# Patient Record
Sex: Male | Born: 1965 | Race: White | Hispanic: No | Marital: Single | Smoking: Current every day smoker
Health system: Southern US, Community
[De-identification: ages and names within clinical notes are randomized; demographics above are authoritative.]

## PROBLEM LIST (undated history)

## (undated) DIAGNOSIS — K5790 Diverticulosis of intestine, part unspecified, without perforation or abscess without bleeding: Secondary | ICD-10-CM

## (undated) HISTORY — PX: HERNIA REPAIR: SHX51

---

## 2016-12-23 ENCOUNTER — Emergency Department (HOSPITAL_COMMUNITY)
Admission: EM | Admit: 2016-12-23 | Discharge: 2016-12-23 | Disposition: A | Discharge status: Home / Self Care | Payer: Self-pay | Attending: Emergency Medicine | Admitting: Emergency Medicine

## 2016-12-23 ENCOUNTER — Encounter (HOSPITAL_COMMUNITY): Payer: Self-pay | Admitting: *Deleted

## 2016-12-23 ENCOUNTER — Emergency Department (HOSPITAL_COMMUNITY): Payer: Self-pay

## 2016-12-23 DIAGNOSIS — Y999 Unspecified external cause status: Secondary | ICD-10-CM | POA: Insufficient documentation

## 2016-12-23 DIAGNOSIS — S60420A Blister (nonthermal) of right index finger, initial encounter: Secondary | ICD-10-CM

## 2016-12-23 DIAGNOSIS — Y939 Activity, unspecified: Secondary | ICD-10-CM | POA: Insufficient documentation

## 2016-12-23 DIAGNOSIS — X58XXXA Exposure to other specified factors, initial encounter: Secondary | ICD-10-CM | POA: Insufficient documentation

## 2016-12-23 DIAGNOSIS — R55 Syncope and collapse: Secondary | ICD-10-CM

## 2016-12-23 DIAGNOSIS — B9789 Other viral agents as the cause of diseases classified elsewhere: Secondary | ICD-10-CM

## 2016-12-23 DIAGNOSIS — Y929 Unspecified place or not applicable: Secondary | ICD-10-CM | POA: Insufficient documentation

## 2016-12-23 DIAGNOSIS — F17210 Nicotine dependence, cigarettes, uncomplicated: Secondary | ICD-10-CM | POA: Insufficient documentation

## 2016-12-23 DIAGNOSIS — R222 Localized swelling, mass and lump, trunk: Secondary | ICD-10-CM

## 2016-12-23 DIAGNOSIS — J069 Acute upper respiratory infection, unspecified: Secondary | ICD-10-CM

## 2016-12-23 HISTORY — DX: Diverticulosis of intestine, part unspecified, without perforation or abscess without bleeding: K57.90

## 2016-12-23 LAB — BASIC METABOLIC PANEL
Anion gap: 7 (ref 5–15)
BUN: 13 mg/dL (ref 6–20)
CALCIUM: 9.3 mg/dL (ref 8.9–10.3)
CO2: 27 mmol/L (ref 22–32)
CREATININE: 0.85 mg/dL (ref 0.61–1.24)
Chloride: 103 mmol/L (ref 101–111)
GFR calc non Af Amer: >60 mL/min (ref >60)
Glucose, Bld: 96 mg/dL (ref 65–99)
Potassium: 4.3 mmol/L (ref 3.5–5.1)
Sodium: 137 mmol/L (ref 135–145)

## 2016-12-23 LAB — CBC WITH DIFFERENTIAL/PLATELET
BASOS PCT: 1 %
Basophils Absolute: 0.1 10*3/uL (ref 0.0–0.1)
Eosinophils Absolute: 0.2 10*3/uL (ref 0.0–0.7)
Eosinophils Relative: 3 %
HCT: 45.9 % (ref 39.0–52.0)
Hemoglobin: 16.1 g/dL (ref 13.0–17.0)
Lymphocytes Relative: 48 %
Lymphs Abs: 2.9 10*3/uL (ref 0.7–4.0)
MCH: 33.2 pg (ref 26.0–34.0)
MCHC: 35.1 g/dL (ref 30.0–36.0)
MCV: 94.6 fL (ref 78.0–100.0)
MONO ABS: 0.6 10*3/uL (ref 0.1–1.0)
MONOS PCT: 10 %
NEUTROS PCT: 38 %
Neutro Abs: 2.3 10*3/uL (ref 1.7–7.7)
PLATELETS: 169 10*3/uL (ref 150–400)
RBC: 4.85 MIL/uL (ref 4.22–5.81)
RDW: 13.7 % (ref 11.5–15.5)
WBC: 6.2 10*3/uL (ref 4.0–10.5)

## 2016-12-23 MED ORDER — ALBUTEROL SULFATE HFA 108 (90 BASE) MCG/ACT IN AERS
2.0000 | INHALATION_SPRAY | RESPIRATORY_TRACT | Status: DC | PRN
  Administered 2016-12-23: 2 via RESPIRATORY_TRACT
  Filled 2016-12-23: qty 6.7

## 2016-12-23 MED ORDER — ALBUTEROL SULFATE HFA 108 (90 BASE) MCG/ACT IN AERS: 1.0000 | INHALATION_SPRAY | Freq: Four times a day (QID) | RESPIRATORY_TRACT | 0 refills | Status: AC | PRN

## 2016-12-23 NOTE — Discharge Instructions (Signed)
You were seen in the ED today with multiple medical complaints. Your labs are normal. You will need to establish care with a PCP and I have listed these options for you.   Follow up with the hand surgeon after discussion with your PCP. Return to the ED with any chest pain, difficulty breathing, or additional episodes of passing out.

## 2016-12-23 NOTE — ED Provider Notes (Signed)
Emergency Department Provider Note   I have reviewed the triage vital signs and the nursing notes.   HISTORY  Chief Complaint Cough   HPI Cindie LarocheJames Mecham is a 51 y.o. male with PMH of diverticulosis, tobacco use, and no PCP presents to the emergency pertinent for evaluation of multiple medical complaints including 2 weeks of URI symptoms, frostbite to the right index finger from 1 week prior, an episode of near syncope 1 week prior, and enlarging chest wall mass for the last 1-2 weeks. Patient states that he's been feeling generally ill for the past 2 weeks and because of this he felt like he almost passed out working outside week ago. He denies any loss of consciousness. He did have some minor head trauma and associated headache that is since resolved. No preceding chest pain, difficulty breathing, heart palpitations. While working outside he also developed some frost bite to the right index finger. He noticed large blister formation that seems to be resolving. He has some resulting eschar formation over the tip of the right finger. He reports normal sensation and range of motion of the finger. No prior history of syncope. Denies alcohol or drug use. No additional symptoms in the last week.  Patient is also concerned about a small, firm mass the bottom of his chest/top of his abdomen. No obvious injury to the area. Denies drainage. No overlying rash. No similar lesions in other parts of the body or in his past. Patient denies having health insurance or a primary care provider. He does not take daily medications. He does continue to smoke cigarettes and drinks occasionally.   Past Medical History:  Diagnosis Date  . Diverticulosis     There are no active problems to display for this patient.   Past Surgical History:  Procedure Laterality Date  . HERNIA REPAIR        Allergies Iodine  No family history on file.  Social History Social History  Substance Use Topics  . Smoking  status: Current Every Day Smoker    Packs/day: 1.00    Types: Cigarettes  . Smokeless tobacco: Never Used  . Alcohol use No    Review of Systems  Constitutional: No fever/chills. Positive near-syncope with fall and head trauma.  Eyes: No visual changes. ENT: No sore throat. Cardiovascular: Denies chest pain. Positive chest wall mass. Respiratory: Denies shortness of breath. Positive cough.  Gastrointestinal: No abdominal pain.  No nausea, no vomiting.  No diarrhea.  No constipation. Genitourinary: Negative for dysuria. Musculoskeletal: Negative for back pain.  Skin: Negative for rash. Positive right index finger blistering.  Neurological: Negative for focal weakness or numbness. HA last week that is now resolved.   10-point ROS otherwise negative.  ____________________________________________   PHYSICAL EXAM:  VITAL SIGNS: ED Triage Vitals [12/23/16 0941]  Enc Vitals Group     BP 153/92     Pulse Rate 81     Resp 18     Temp 98.3 F (36.8 C)     Temp Source Oral     SpO2 100 %     Weight 180 lb (81.6 kg)     Height 5\' 11"  (1.803 m)     Pain Score 0   Constitutional: Alert and oriented. Well appearing and in no acute distress. Eyes: Conjunctivae are normal. PERRL. Head: Atraumatic. Ears:  Healthy appearing ear canals and TMs bilaterally Nose: No congestion/rhinnorhea. Mouth/Throat: Mucous membranes are moist.  Oropharynx non-erythematous. Neck: No stridor.  Cardiovascular: Normal rate, regular rhythm.  Good peripheral circulation. Grossly normal heart sounds.   Respiratory: Normal respiratory effort.  No retractions. Lungs CTAB. Gastrointestinal: Soft and nontender. No distention.  Musculoskeletal: No lower extremity tenderness nor edema. No gross deformities of extremities. Firm, non-mobile, non-tender mass at the base of the sternum.  Neurologic:  Normal speech and language. No gross focal neurologic deficits are appreciated.  Skin:  Skin is warm, dry and intact.  Firm skin with discoloration to the tip of the right index finger. No surrounding erythema, drainage, or fluctuance.  Psychiatric: Mood and affect are normal. Speech and behavior are normal.  ____________________________________________   LABS (all labs ordered are listed, but only abnormal results are displayed)  Labs Reviewed  BASIC METABOLIC PANEL  CBC WITH DIFFERENTIAL/PLATELET   ____________________________________________  EKG   EKG Interpretation  Date/Time:  Friday December 23 2016 15:04:52 EST Ventricular Rate:  68 PR Interval:    QRS Duration: 101 QT Interval:  386 QTC Calculation: 411 R Axis:   99 Text Interpretation:  Right and left arm electrode reversal, interpretation assumes no reversal Sinus rhythm Probable lateral infarct, age indeterminate Minimal ST elevation, inferior leads No STEMI.  Confirmed by LONG MD, JOSHUA [phone]) on 12/23/2016 3:29:32 PM      ____________________________________________  RADIOLOGY  EXAM:  CHEST 2 VIEW    COMPARISON: None.    FINDINGS:  The heart size and mediastinal contours are within normal limits.  Both lungs are clear. No pleural effusion or pneumothorax. The  visualized skeletal structures are unremarkable.    IMPRESSION:  No active cardiopulmonary disease.      Electronically Signed  By: Amie Portland M.D.  On: 12/23/2016 10:20    ____________________________________________   PROCEDURES  Procedure(s) performed:   Procedures  ULTRASOUND LIMITED SOFT TISSUE/ MUSCULOSKELETAL:  Indication: Central chest mass Linear probe used to evaluate area of interest in two planes. Findings:  No identifiable fluid collection. Performed by: Dr Jacqulyn Bath Images saved electronically ____________________________________________   INITIAL IMPRESSION / ASSESSMENT AND PLAN / ED COURSE  Pertinent labs & imaging results that were available during my care of the patient were reviewed by me and considered in my  medical decision making (see chart for details).  Patient resents to the emergency department for evaluation of multiple medical complaints.  Index finger blistering:  Patient with possible exposure injury. There is some firm blistering to the tip of the right finger. No picture above. No debridement required at this time. No evidence of infection. No indication for antibiotics. Plan for primary care physician evaluation and referral to hand surgery as needed. Plan to provide follow-up information for hand surgery at discharge. Advise standard wound care and monitoring.   Near-syncope with fall and head trauma: Near syncope event 1 week prior that resulted in a fall with mild head trauma. Headache resolved. Neurological exam is nonfocal. No indication for CT scan of the head with remote incident and return to normal function. May have experienced a mild concussion. Will obtain baseline chemistry and CBC for further evaluation.   Chest wall mass:  Firm mass that appears affixed to the lower part of the sternum. No fluctuance or erythema. Low suspicion for abscess or cyst. Plan for bedside US and PCP follow up.   URI symptoms for the last 2 weeks:  Patient with URI symptoms for the past 2 weeks. Chest x-ray with no focal infiltrate. Advised patient on smoking cessation. Plan to discharge with albuterol inhaler at discharge.   At this time, I do not feel there  is any life-threatening condition present. I have reviewed and discussed all results (EKG, imaging, lab, urine as appropriate), exam findings with patient. I have reviewed nursing notes and appropriate previous records.  I feel the patient is safe to be discharged home without further emergent workup. Discussed usual and customary return precautions. Patient and family (if present) verbalize understanding and are comfortable with this plan.  Patient will follow-up with their primary care provider. If they do not have a primary care provider,  information for follow-up has been provided to them. All questions have been answered.  ____________________________________________  FINAL CLINICAL IMPRESSION(S) / ED DIAGNOSES  Final diagnoses:  Viral URI with cough  Near syncope  Blister (nonthermal) of right index finger, initial encounter  Chest wall mass     MEDICATIONS GIVEN DURING THIS VISIT:  None  NEW OUTPATIENT MEDICATIONS STARTED DURING THIS VISIT:  Discharge Medication List as of 12/23/2016  4:17 PM    START taking these medications   Details  albuterol (PROVENTIL HFA;VENTOLIN HFA) 108 (90 Base) MCG/ACT inhaler Inhale 1-2 puffs into the lungs every 6 (six) hours as needed for wheezing or shortness of breath., Starting Fri 12/23/2016, Print         Note:  This document was prepared using Dragon voice recognition software and may include unintentional dictation errors.  Alona Bene, MD Emergency Medicine   Maia Plan, MD 12/24/16 1050

## 2016-12-23 NOTE — ED Triage Notes (Signed)
Pt comes in with multiple issues. Pt states he was out in the woods last week when he had and episode of lightheadedness and fell to the ground, states he hit his head. Denies loss of consciousness. Pt states while he was in the woods he got frost bite on his right index finger. Also, pt has had a cough and congestion that he can't get over. States this has been going on 2 weeks. Pt adds that he has a knot in his chest that he wants looked at. Pt has family history of cysts.   Pt denies having a family doctor.

## 2018-02-13 IMAGING — DX DG CHEST 2V
2 series · 2 of 2 positions shown · non-contrast
Comparison: None.

CLINICAL DATA: Cough and congestion x couple of weeks/light
headiness x 1 week/lump on chest x couple of months/smoker

EXAM:
CHEST  2 VIEW

[chest pa]
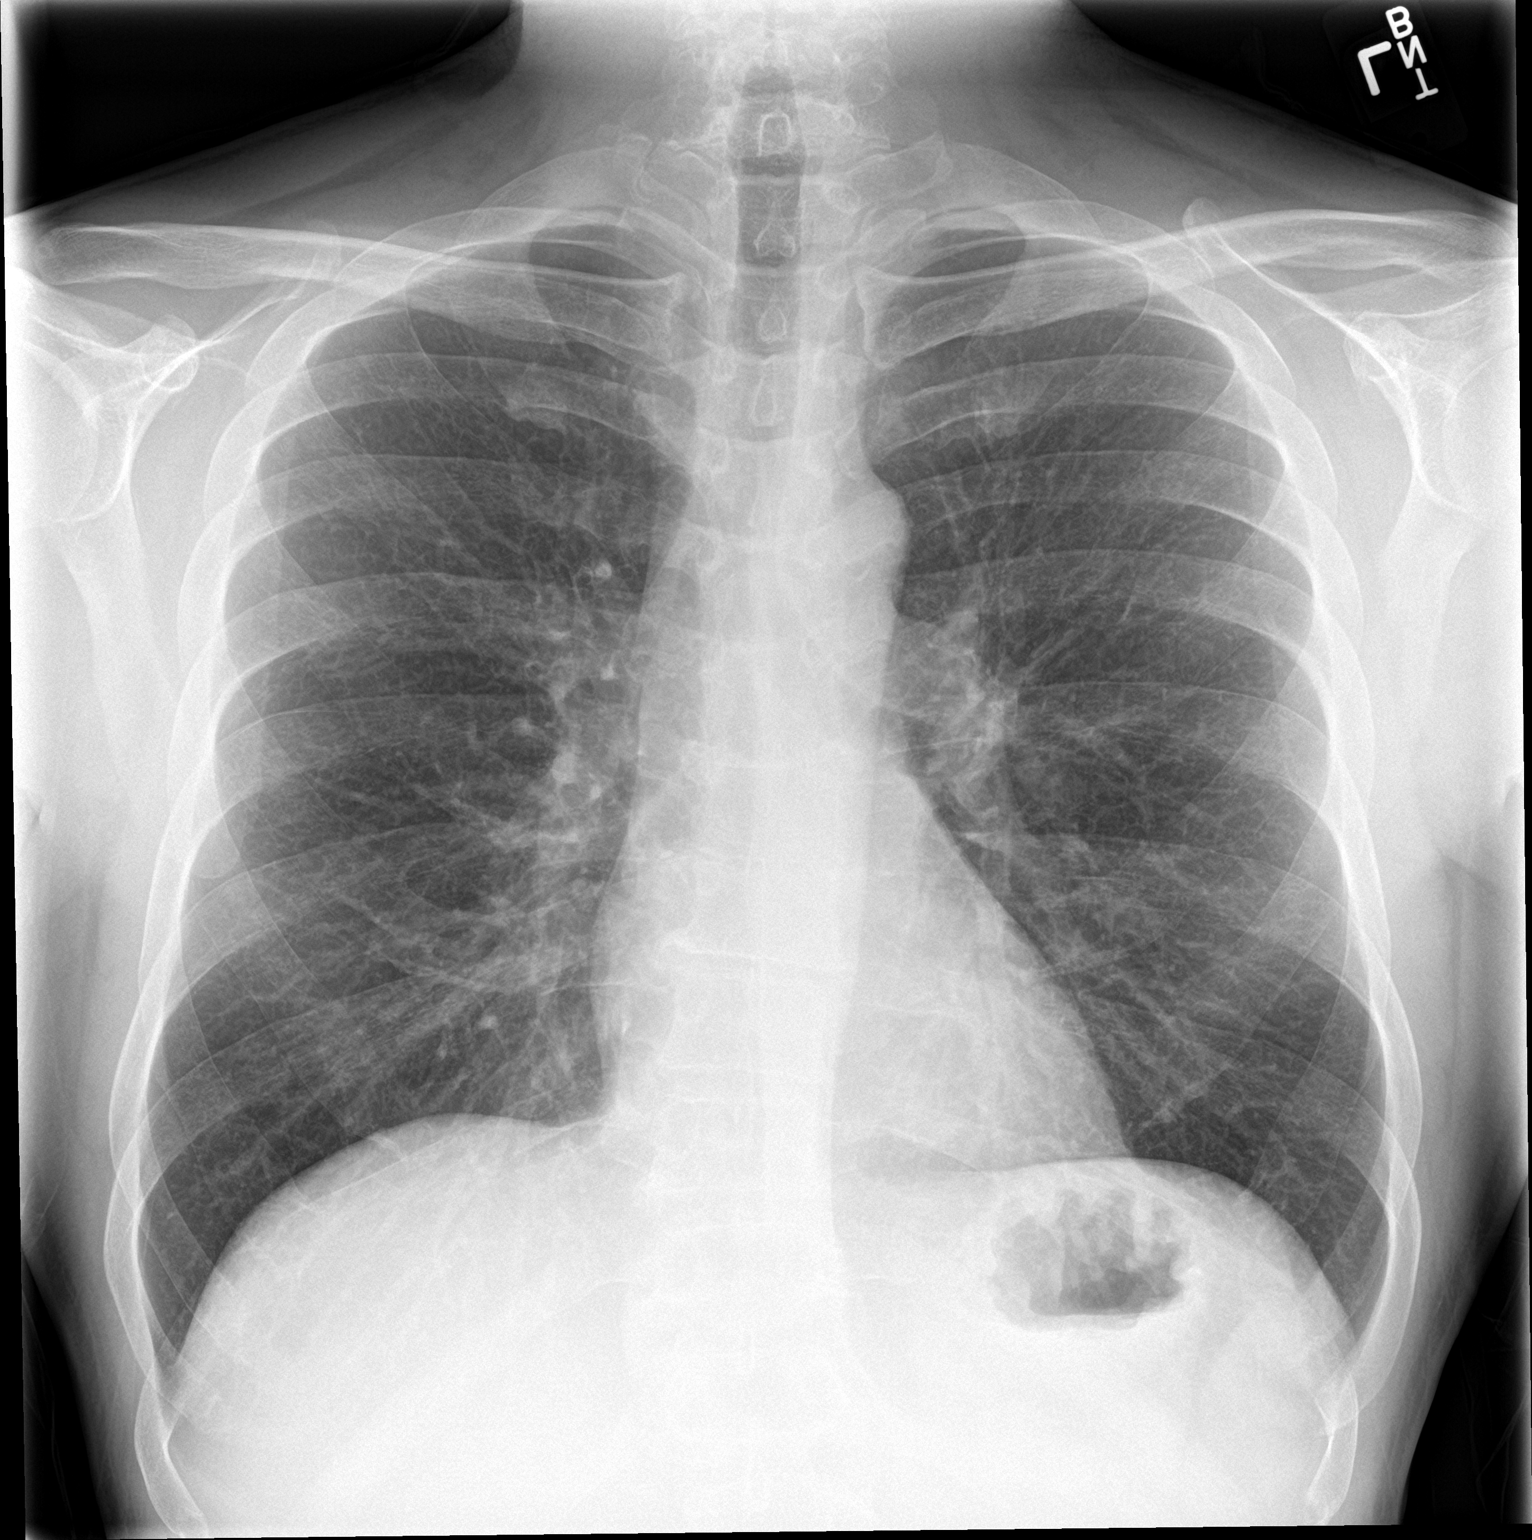

[chest lat]
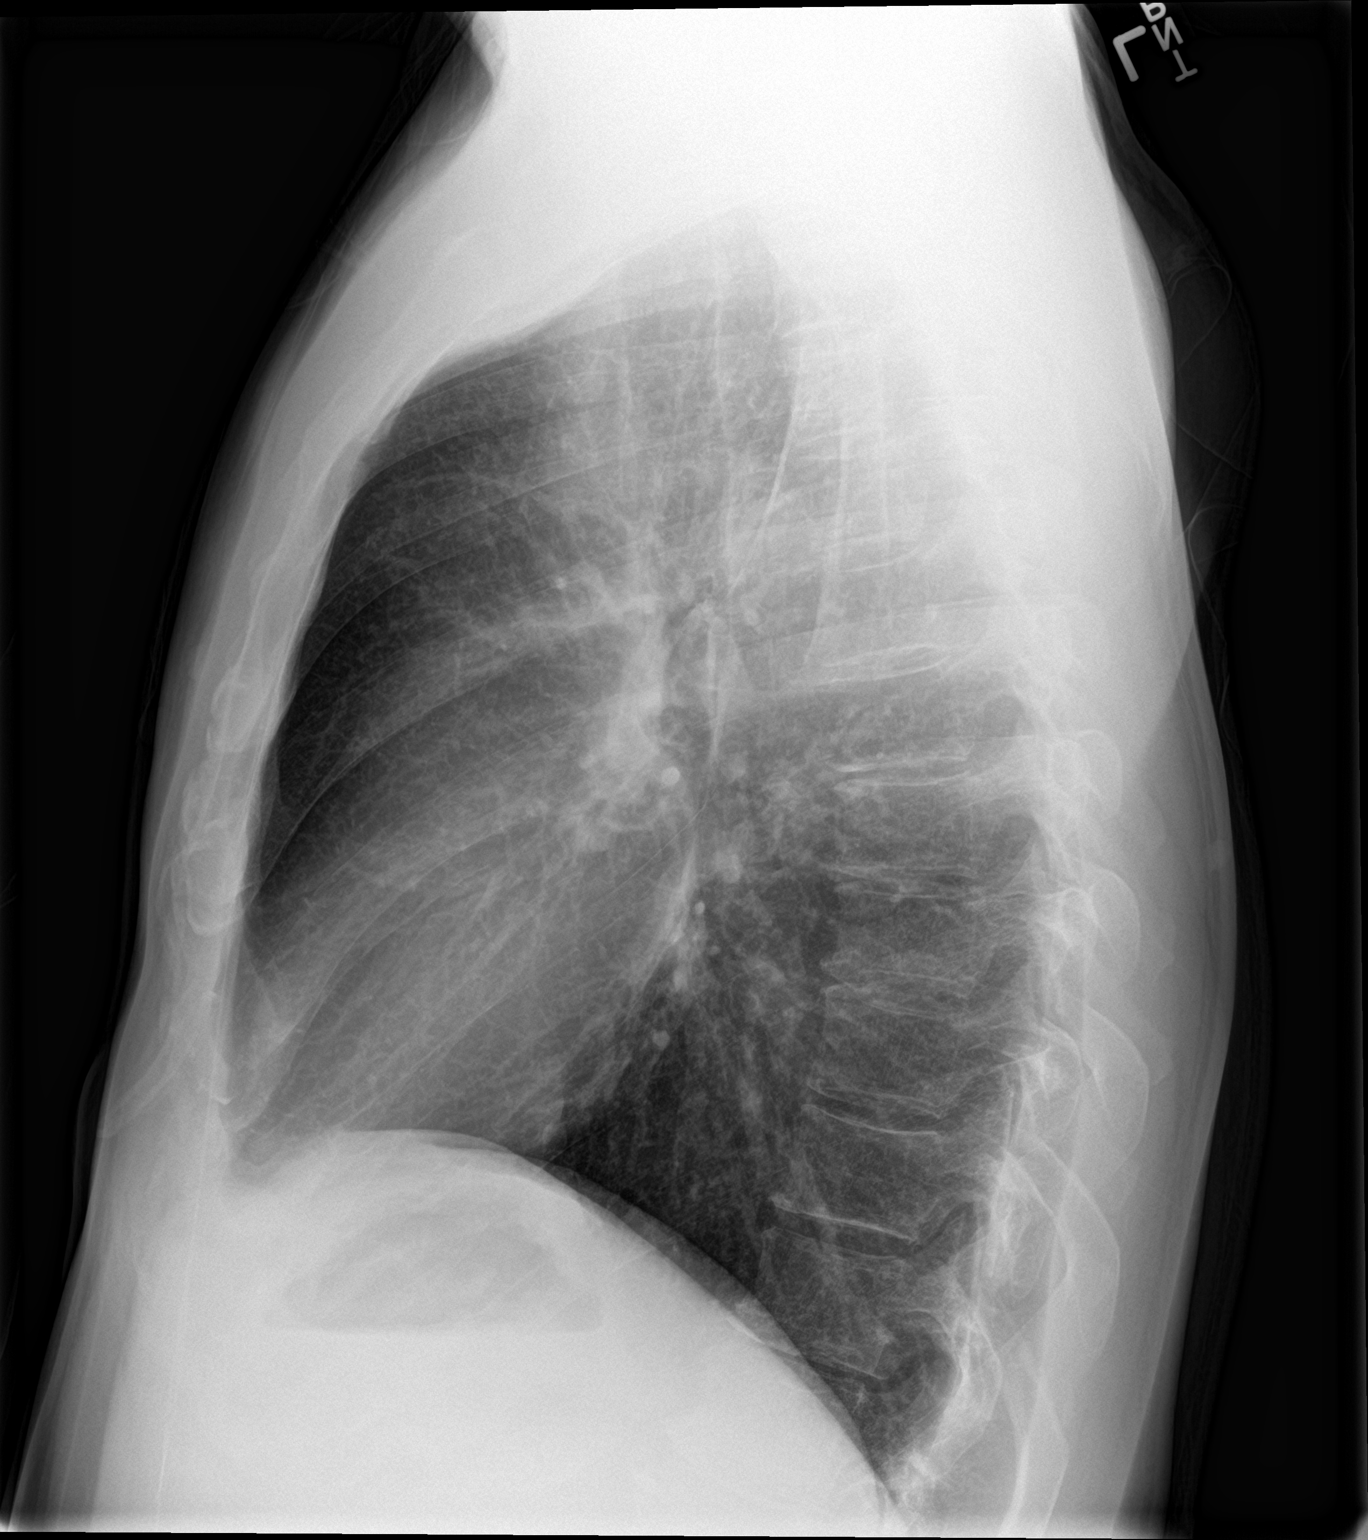

[2 of 2 positions shown; findings below may reference images not displayed]

FINDINGS: The heart size and mediastinal contours are within normal limits.
Both lungs are clear. No pleural effusion or pneumothorax. The
visualized skeletal structures are unremarkable.
IMPRESSION: No active cardiopulmonary disease.

## 2024-05-14 ENCOUNTER — Other Ambulatory Visit: Payer: Self-pay

## 2024-05-14 ENCOUNTER — Encounter (HOSPITAL_COMMUNITY): Payer: Self-pay

## 2024-05-14 ENCOUNTER — Emergency Department (HOSPITAL_COMMUNITY)
Admission: EM | Admit: 2024-05-14 | Discharge: 2024-05-14 | Disposition: A | Discharge status: Home / Self Care | Payer: Self-pay | Attending: Student | Admitting: Student

## 2024-05-14 DIAGNOSIS — R1030 Lower abdominal pain, unspecified: Secondary | ICD-10-CM | POA: Insufficient documentation

## 2024-05-14 DIAGNOSIS — F17210 Nicotine dependence, cigarettes, uncomplicated: Secondary | ICD-10-CM | POA: Insufficient documentation

## 2024-05-14 DIAGNOSIS — B356 Tinea cruris: Secondary | ICD-10-CM | POA: Insufficient documentation

## 2024-05-14 MED ORDER — NYSTATIN 100000 UNIT/GM EX POWD: Freq: Once | CUTANEOUS | Status: DC

## 2024-05-14 MED ORDER — CLOTRIMAZOLE 1 % EX CREA
TOPICAL_CREAM | Freq: Two times a day (BID) | CUTANEOUS | Status: DC
  Filled 2024-05-14: qty 15

## 2024-05-14 MED ORDER — CLOTRIMAZOLE 1 % EX CREA: TOPICAL_CREAM | CUTANEOUS | 0 refills | Status: AC

## 2024-05-14 NOTE — ED Notes (Signed)
Discharge instructions given to patient, no further questions at this time.

## 2024-05-14 NOTE — ED Provider Triage Note (Signed)
 Emergency Medicine Provider Triage Evaluation Note  Cole Duke , a 58 y.o. male  was evaluated in triage.  Pt complains of bilateral groin itching, drainage and possible swollen lymph nodes.  No fevers, no dysuria.  Pulled 2 ticks off right lower leg recently.  Review of Systems  Positive: Tick bites,  groin itching and swelling Negative: Fever, n/v.  Physical Exam  BP (!) 169/89 (BP Location: Right Arm)   Pulse 90   Temp (!) 97.5 F (36.4 C) (Oral)   Resp 16   Ht 5' 11 (1.803 m)   Wt 81.6 kg   SpO2 99%   BMI 25.09 kg/m  Gen:   Awake, no distress   Resp:  Normal effort  MSK:   Moves extremities without difficulty  Other:    Medical Decision Making  Medically screening exam initiated at 9:31 AM.  Appropriate orders placed.  Eural Holzschuh was informed that the remainder of the evaluation will be completed by another provider, this initial triage assessment does not replace that evaluation, and the importance of remaining in the ED until their evaluation is complete.     Katherine Pancake, PA-C 05/14/24 [phone]

## 2024-05-14 NOTE — ED Notes (Signed)
 Pt reports groin area is itchy and moist and has tried using anti-itch cream w/o relief. Pt also reports recent tick bites.

## 2024-05-14 NOTE — ED Provider Notes (Signed)
 [name] EMERGENCY DEPARTMENT AT [facility] Provider Note  CSN: [id number] Arrival date & time: 05/14/24 0901  Chief Complaint(s) Lymphadenopathy  HPI Cole Duke is a 58 y.o. male who presents emergency room for evaluation of groin pain, itching and swelling.  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  Over the last 24 hours he states that he noticed rapid onset redness and itchiness to the groin.  No associated fever, nausea, vomiting, chest pain, shortness of breath or other systemic symptoms.   Past Medical History Past Medical History:  Diagnosis Date   Diverticulosis    There are no active problems to display for this patient.  Home Medication(s) Prior to Admission medications   Medication Sig Start Date End Date Taking? Authorizing Provider  clotrimazole (LOTRIMIN) 1 % cream Apply to affected area 2 times daily 05/14/24  Yes Aaima Gaddie, MD  albuterol  (PROVENTIL  HFA;VENTOLIN  HFA) 108 (90 Base) MCG/ACT inhaler Inhale 1-2 puffs into the lungs every 6 (six) hours as needed for wheezing or shortness of breath. 12/23/16   Long, Shereen Dike, MD                                                                                                                                    Past Surgical History Past Surgical History:  Procedure Laterality Date   HERNIA REPAIR     Family History History reviewed. No pertinent family history.  Social History Social History   Tobacco Use   Smoking status: Every Day    Current packs/day: 1.00    Types: Cigarettes   Smokeless tobacco: Never  Vaping Use   Vaping status: Never Used  Substance Use Topics   Alcohol use: No   Drug use: No   Allergies Iodine  Review of Systems Review of Systems  Skin:  Positive for rash.    Physical Exam Vital Signs  I have reviewed the triage vital signs BP (!) 169/89 (BP Location: Right Arm)   Pulse 90   Temp (!) 97.5 F (36.4 C) (Oral)   Resp  16   Ht 5' 11 (1.803 m)   Wt 81.6 kg   SpO2 99%   BMI 25.09 kg/m   Physical Exam Constitutional:      General: He is not in acute distress.    Appearance: Normal appearance.  HENT:     Head: Normocephalic and atraumatic.     Nose: No congestion or rhinorrhea.   Eyes:     General:        Right eye: No discharge.        Left eye: No discharge.     Extraocular Movements: Extraocular movements intact.     Pupils: Pupils are equal, round, and reactive to light.    Cardiovascular:     Rate and Rhythm: Normal rate and regular rhythm.     Heart sounds: No murmur  heard. Pulmonary:     Effort: No respiratory distress.     Breath sounds: No wheezing or rales.  Abdominal:     General: There is no distension.     Tenderness: There is no abdominal tenderness.  Genitourinary:    Comments: Erythema with satellite lesions  Musculoskeletal:        General: Normal range of motion.     Cervical back: Normal range of motion.   Skin:    General: Skin is warm and dry.   Neurological:     General: No focal deficit present.     Mental Status: He is alert.     ED Results and Treatments Labs (all labs ordered are listed, but only abnormal results are displayed) Labs Reviewed - No data to display                                                                                                                        Radiology No results found.  Pertinent labs & imaging results that were available during my care of the patient were reviewed by me and considered in my medical decision making (see MDM for details).  Medications Ordered in ED Medications  clotrimazole (LOTRIMIN) 1 % cream (has no administration in time range)                                                                                                                                     Procedures Procedures  (including critical care time)  Medical Decision Making / ED Course   This patient presents to the ED  for concern of rash, this involves an extensive number of treatment options, and is a complaint that carries with it a high risk of complications and morbidity.  The differential diagnosis includes tinea cruris, intertrigo, cellulitis, NSTI  MDM: Patient seen emergency room for evaluation of a rash, groin pain and swelling.  Physical exam with erythema in the perineum bilaterally with associated satellite lesions consistent with tinea cruris.  Clotrimazole ordered.  No associated crepitus and low suspicion for NSTI.  No tenderness to palpation.  With exam consistent with tinea infection, CT imaging unlikely to change management and thus deferred.  Encouraged patient to keep the area as dry as possible.  Return precautions given which she voiced understanding he was discharged with outpatient follow-up.   Additional history obtained: -External records from outside source obtained and  reviewed including: Chart review including previous notes, labs, imaging, consultation notes   Medicines ordered and prescription drug management: Meds ordered this encounter  Medications   DISCONTD: nystatin (MYCOSTATIN/NYSTOP) topical powder   clotrimazole (LOTRIMIN) 1 % cream   clotrimazole (LOTRIMIN) 1 % cream    Sig: Apply to affected area 2 times daily    Dispense:  28 g    Refill:  0    -I have reviewed the patients home medicines and have made adjustments as needed  Critical interventions none  Social Determinants of Health:  Factors impacting patients care include: Works outside in the heat, 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

## 2024-05-14 NOTE — ED Triage Notes (Signed)
 Pt arrived via POV c/o swelling in his bilateral groin, upper leg X 2 days. Pt feels like his lymph nodes are swollen. Pt denies dysuria.

## 2024-05-26 ENCOUNTER — Other Ambulatory Visit: Payer: Self-pay

## 2024-05-26 ENCOUNTER — Emergency Department (HOSPITAL_COMMUNITY)
Admission: EM | Admit: 2024-05-26 | Discharge: 2024-05-26 | Disposition: A | Discharge status: Home / Self Care | Payer: Self-pay | Attending: Emergency Medicine | Admitting: Emergency Medicine

## 2024-05-26 ENCOUNTER — Encounter (HOSPITAL_COMMUNITY): Payer: Self-pay

## 2024-05-26 DIAGNOSIS — L03114 Cellulitis of left upper limb: Secondary | ICD-10-CM | POA: Insufficient documentation

## 2024-05-26 MED ORDER — DOXYCYCLINE HYCLATE 100 MG PO CAPS: 100.0000 mg | ORAL_CAPSULE | Freq: Two times a day (BID) | ORAL | 0 refills | Status: AC

## 2024-05-26 NOTE — ED Triage Notes (Signed)
 Pt arrived POV with c/o of insect bite on left forearm since Thurs. Area is red, swollen and no drainage.

## 2024-05-26 NOTE — Discharge Instructions (Signed)
 Please follow-up closely with your primary care doctor on an outpatient basis.  Take all antibiotics as directed.  Return to emergency department immediately for any new or worsening symptoms.  [facility] Primary Care Doctor List    Rollene Pesa, MD. Specialty: [facility] Medicine Contact information: [address]  [place] [address]  [phone]   Glendia Fielding, MD. Specialty: [facility] Contact information: [address]  [place] [address]  [phone]   Benita Outhouse, MD Specialty: Internal Medicine Contact information: [address] [place] [address]  [phone]   Darlyn Hurst, MD. Specialty: Internal Medicine Contact information: [address]  [place] [address]  [phone]    [facility] (Dr. Luke) Specialty: Family Medicine Contact information: [address]  [place] [address]  [phone]   Garnette Lolling, MD. Specialty: [facility] Medicine Contact information: [address]  [address]  [place] [address]  [phone]   Gaither Langton, MD. Specialty: Internal Medicine Contact information: [address]  [address]  [place] [address]  [phone]   [facility] Medicine: [address] [phone]  [place], Family medicine [address]  [phone]  [facility] [address] [place], [place] [phone]  [facility]: [address] [phone]    [facility]  [address] [place], [place] [address] [phone]  Services [facility] offers a variety of basic health services.  Services include but are not limited to: Blood pressure checks  Heart rate checks  Blood sugar checks  Urine analysis  Rapid strep tests  Pregnancy tests.  Health education and referrals  People needing more complex services will be directed to a physician online. Using these  virtual visits, doctors can evaluate and prescribe medicine and treatments. There will be no medication on-site, though [facility] will help patients fill their prescriptions at little to no cost.   For More information please go to: [email or web address]  Allergy and Asthma:    [address] [place] [phone]  Urology:  [address]   [phone]  [facility]  [address] [place], [place] [phone]  Orthopedics   [address] [place], [place] [phone]  Endocrinology  [address] [place], [place] [phone]  Podiatry: [facility] [phone]

## 2024-05-26 NOTE — ED Provider Notes (Signed)
 [place] EMERGENCY DEPARTMENT AT [facility] Provider Note   CSN: [id number] Arrival date & time: 05/26/24  9066     Patient presents with: Insect Bite   Cole Duke is a 58 y.o. male.   Patient is a 58 year old male who presents to the emergency department with a chief complaint of an area of redness and swelling to his left forearm which has been present for approximate the past 4 days but has been gradually becoming worse.  He does note that he has had multiple tick bites over the summer.  He denies any fever, chills, chest pain, shortness of breath, Donnell pain, nausea, vomiting, diarrhea.  He notes he has got a small amount of clear discharge out of the site.        Prior to Admission medications   Medication Sig Start Date End Date Taking? Authorizing Provider  albuterol  (PROVENTIL  HFA;VENTOLIN  HFA) 108 (90 Base) MCG/ACT inhaler Inhale 1-2 puffs into the lungs every 6 (six) hours as needed for wheezing or shortness of breath. 12/23/16   Long, Fonda MATSU, MD  clotrimazole  (LOTRIMIN ) 1 % cream Apply to affected area 2 times daily 05/14/24   Kommor, Lum, MD    Allergies: Iodine    Review of Systems  Skin:        Area of erythema to left forearm  All other systems reviewed and are negative.   Updated Vital Signs BP (!) 163/89   Pulse 85   Temp 98.3 F (36.8 C) (Oral)   Resp 17   Ht 5' 11 (1.803 m)   Wt 95.3 kg   SpO2 96%   BMI 29.29 kg/m   Physical Exam Vitals and nursing note reviewed.  Constitutional:      Appearance: Normal appearance.  HENT:     Head: Normocephalic and atraumatic.   Eyes:     Extraocular Movements: Extraocular movements intact.     Conjunctiva/sclera: Conjunctivae normal.     Pupils: Pupils are equal, round, and reactive to light.    Cardiovascular:     Rate and Rhythm: Normal rate and regular rhythm.     Pulses: Normal pulses.  Pulmonary:     Effort: Pulmonary effort is normal. No respiratory distress.  Abdominal:      General: Bowel sounds are normal.   Musculoskeletal:        General: No deformity or signs of injury. Normal range of motion.   Skin:    General: Skin is warm and dry.     Comments: Localized area of induration to volar aspect of left forearm, no areas of fluctuance, no lymphatic streaking   Neurological:     General: No focal deficit present.     Mental Status: He is alert and oriented to person, place, and time. Mental status is at baseline.   Psychiatric:        Mood and Affect: Mood normal.        Behavior: Behavior normal.        Thought Content: Thought content normal.        Judgment: Judgment normal.     (all labs ordered are listed, but only abnormal results are displayed) Labs Reviewed - No data to display  EKG: None  Radiology: No results found.   Procedures   Medications Ordered in the ED - No data to display  Medical Decision Making Patient is doing well at this time and is stable for discharge home.  Discussed with patient we will cover him for cellulitis at this point.  Is unclear if this may have been a tick bite so we will cover him with doxycycline.  Patient has no indication for a drainable abscess at this time as the area just appears to be induration with no associated fluctuant area.  Patient has no associated lymphatic streaking.  He has no systemic symptoms at this point do not suspect sepsis.  He has no indication for necrotizing fasciitis.  Close follow-up with primary care doctor was discussed as well as strict return precautions for any new or worsening symptoms.  Patient voiced understanding and had no additional questions.        Final diagnoses:  None    ED Discharge Orders     None          Daralene Lonni JONETTA DEVONNA 05/26/24 9041    Suzette Pac, MD 05/27/24 1053
